# Patient Record
Sex: Male | Born: 2000 | Race: Black or African American | Hispanic: No | Marital: Single | Smoking: Never smoker
Health system: Southern US, Community
[De-identification: ages and names within clinical notes are randomized; demographics above are authoritative.]

## PROBLEM LIST (undated history)

## (undated) DIAGNOSIS — R51 Headache: Secondary | ICD-10-CM

## (undated) DIAGNOSIS — G8929 Other chronic pain: Secondary | ICD-10-CM

---
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
## 2020-01-24 ENCOUNTER — Other Ambulatory Visit: Payer: Self-pay

## 2020-01-24 ENCOUNTER — Emergency Department (HOSPITAL_COMMUNITY)
Admission: EM | Admit: 2020-01-24 | Discharge: 2020-01-24 | Disposition: A | Discharge status: Home / Self Care | Payer: BC Managed Care – PPO | Attending: Emergency Medicine | Admitting: Emergency Medicine

## 2020-01-24 ENCOUNTER — Encounter (HOSPITAL_COMMUNITY): Payer: Self-pay | Admitting: Emergency Medicine

## 2020-01-24 DIAGNOSIS — R197 Diarrhea, unspecified: Secondary | ICD-10-CM | POA: Insufficient documentation

## 2020-01-24 DIAGNOSIS — R1013 Epigastric pain: Secondary | ICD-10-CM | POA: Diagnosis not present

## 2020-01-24 DIAGNOSIS — R112 Nausea with vomiting, unspecified: Secondary | ICD-10-CM | POA: Insufficient documentation

## 2020-01-24 LAB — URINALYSIS, ROUTINE W REFLEX MICROSCOPIC
Bilirubin Urine: NEGATIVE
Glucose, UA: NEGATIVE mg/dL
Hgb urine dipstick: NEGATIVE
Ketones, ur: NEGATIVE mg/dL
Leukocytes,Ua: NEGATIVE
Nitrite: NEGATIVE
Protein, ur: NEGATIVE mg/dL
Specific Gravity, Urine: 1.033 — ABNORMAL HIGH (ref 1.005–1.030)
pH: 5 (ref 5.0–8.0)

## 2020-01-24 LAB — COMPREHENSIVE METABOLIC PANEL
ALT: 22 U/L (ref 0–44)
AST: 18 U/L (ref 15–41)
Albumin: 4.5 g/dL (ref 3.5–5.0)
Alkaline Phosphatase: 87 U/L (ref 38–126)
Anion gap: 13 (ref 5–15)
BUN: 18 mg/dL (ref 6–20)
CO2: 24 mmol/L (ref 22–32)
Calcium: 10.1 mg/dL (ref 8.9–10.3)
Chloride: 101 mmol/L (ref 98–111)
Creatinine, Ser: 1.18 mg/dL (ref 0.61–1.24)
GFR, Estimated: >60 mL/min (ref >60)
Glucose, Bld: 122 mg/dL — ABNORMAL HIGH (ref 70–99)
Potassium: 4.1 mmol/L (ref 3.5–5.1)
Sodium: 138 mmol/L (ref 135–145)
Total Bilirubin: 0.9 mg/dL (ref 0.3–1.2)
Total Protein: 7.9 g/dL (ref 6.5–8.1)

## 2020-01-24 LAB — CBC
HCT: 49.3 % (ref 39.0–52.0)
Hemoglobin: 17.4 g/dL — ABNORMAL HIGH (ref 13.0–17.0)
MCH: 31.8 pg (ref 26.0–34.0)
MCHC: 35.3 g/dL (ref 30.0–36.0)
MCV: 90 fL (ref 80.0–100.0)
Platelets: 315 10*3/uL (ref 150–400)
RBC: 5.48 MIL/uL (ref 4.22–5.81)
RDW: 11.8 % (ref 11.5–15.5)
WBC: 9.6 10*3/uL (ref 4.0–10.5)
nRBC: 0 % (ref 0.0–0.2)

## 2020-01-24 LAB — RAPID URINE DRUG SCREEN, HOSP PERFORMED
Amphetamines: NOT DETECTED
Barbiturates: NOT DETECTED
Benzodiazepines: NOT DETECTED
Cocaine: NOT DETECTED
Opiates: NOT DETECTED
Tetrahydrocannabinol: NOT DETECTED

## 2020-01-24 LAB — LIPASE, BLOOD: Lipase: 27 U/L (ref 11–51)

## 2020-01-24 MED ORDER — SUCRALFATE 1 G PO TABS: 1.0000 g | ORAL_TABLET | Freq: Three times a day (TID) | ORAL | 0 refills | Status: AC

## 2020-01-24 MED ORDER — FAMOTIDINE 20 MG PO TABS: 20.0000 mg | ORAL_TABLET | Freq: Two times a day (BID) | ORAL | 0 refills | Status: AC

## 2020-01-24 MED ORDER — PANTOPRAZOLE SODIUM 40 MG IV SOLR
40.0000 mg | Freq: Once | INTRAVENOUS | Status: AC
Start: 2020-01-24 — End: 2020-01-24
  Administered 2020-01-24: 40 mg via INTRAVENOUS
  Filled 2020-01-24: qty 40

## 2020-01-24 MED ORDER — MORPHINE SULFATE (PF) 4 MG/ML IV SOLN
4.0000 mg | Freq: Once | INTRAVENOUS | Status: AC
  Administered 2020-01-24: 4 mg via INTRAVENOUS
  Filled 2020-01-24: qty 1

## 2020-01-24 MED ORDER — LIDOCAINE VISCOUS HCL 2 % MT SOLN
15.0000 mL | Freq: Once | OROMUCOSAL | Status: AC
  Administered 2020-01-24: 15 mL via ORAL
  Filled 2020-01-24: qty 15

## 2020-01-24 MED ORDER — SODIUM CHLORIDE 0.9 % IV BOLUS
1000.0000 mL | Freq: Once | INTRAVENOUS | Status: AC
Start: 2020-01-24 — End: 2020-01-24
  Administered 2020-01-24: 1000 mL via INTRAVENOUS

## 2020-01-24 MED ORDER — ALUM & MAG HYDROXIDE-SIMETH 200-200-20 MG/5ML PO SUSP
30.0000 mL | Freq: Once | ORAL | Status: AC
  Administered 2020-01-24: 30 mL via ORAL
  Filled 2020-01-24: qty 30

## 2020-01-24 MED ORDER — ONDANSETRON HCL 4 MG/2ML IJ SOLN
4.0000 mg | Freq: Once | INTRAMUSCULAR | Status: AC
  Administered 2020-01-24: 4 mg via INTRAVENOUS
  Filled 2020-01-24: qty 2

## 2020-01-24 MED ORDER — ONDANSETRON 4 MG PO TBDP: 4.0000 mg | ORAL_TABLET | Freq: Three times a day (TID) | ORAL | 0 refills | Status: AC | PRN

## 2020-01-24 NOTE — Discharge Instructions (Addendum)
Take the medications as prescribed.   Carafate to coat your stomach before meals Zofran for nausea Pepcid an acid reducer.  Return for new or worsening symptoms

## 2020-01-24 NOTE — ED Notes (Signed)
Pt discharge instructions and prescriptions reviewed with the patient. The patient verbalized understanding of both. Pt discharged. 

## 2020-01-24 NOTE — ED Triage Notes (Signed)
Patient reports generalized abdominal pain with emesis and diarrhea onset yesterday.

## 2020-01-24 NOTE — ED Provider Notes (Signed)
[facility] EMERGENCY DEPARTMENT Provider Note   CSN: [id number] Arrival date & time: 01/24/20  0932    History Chief Complaint  Patient presents with  . Abdominal Pain    Evan Shepard is Shepard 19 y.o. male with past medical history significant for IBS,  Chronic daily headache who presents for evaluation of Abd pain. Pain began 12 hours PTA.  Pain located epigastric region.  Does not radiate.  He does not frequently use Tylenol, ibuprofen, EtOH.  No marijuana use.  Associated NBNB emesis as well as 4 episodes of diarrhea that melena or blood per rectum.  Has had similar symptoms in the past and diagnosed with PUD.  Previously by [facility].  Posterior EGD done however was never performed.  He has not followed up with GI as he states his symptoms had improved with PPI.  He is no longer taking this.  Did have Zaxby's yesterday prior to symptoms beginning. No known sick contacts. Denies fever, chills, CP, SOB, hematemesis, back pain, dysuria, weakness. Took 2 Tylenol last night for the pain without relief. Denies additional aggravating or alleviating factors.  History obtained from patient and past medical records. No interpretor was used.   HPI     Past Medical History:  Diagnosis Date  . Chronic headaches     There are no problems to display for this patient.   History reviewed. No pertinent surgical history.     No family history on file.  Social History   Tobacco Use  . Smoking status: Never Smoker  . Smokeless tobacco: Never Used  Vaping Use  . Vaping Use: Never used  Substance Use Topics  . Alcohol use: No  . Drug use: No    Home Medications Prior to Admission medications   Medication Sig Start Date End Date Taking? Authorizing Provider  Acetaminophen (TYLENOL) 325 MG CAPS Take 1 tablet by mouth daily as needed (pain).   Yes [provider]  famotidine (PEPCID) 20 MG tablet Take 1 tablet (20 mg total) by mouth 2 (two) times  daily. 01/24/20   Evan Donna A, PA-C  ondansetron (ZOFRAN ODT) 4 MG disintegrating tablet Take 1 tablet (4 mg total) by mouth every 8 (eight) hours as needed for nausea or vomiting. 01/24/20   [name], PA-C  ondansetron (ZOFRAN) 4 MG tablet Take 1 tablet (4 mg total) by mouth every 6 (six) hours. As needed for nausea or vomiting Patient not taking: Reported on 01/24/2020 07/27/14   [name], MD  sucralfate (CARAFATE) 1 g tablet Take 1 tablet (1 g total) by mouth 4 (four) times daily -  with meals and at bedtime for 7 days. 01/24/20 01/31/20  Field [name], PA-C    Allergies    Patient has no known allergies.  Review of Systems   Review of Systems  Constitutional: Negative.   HENT: Negative.   Respiratory: Negative.   Cardiovascular: Negative.   Gastrointestinal: Positive for abdominal pain, diarrhea, nausea and vomiting. Negative for abdominal distention, anal bleeding, blood in stool, constipation and rectal pain.  Genitourinary: Negative.   Musculoskeletal: Negative.   Skin: Negative.   Neurological: Negative.   All other systems reviewed and are negative.   Physical Exam Updated Vital Signs BP 114/61   Pulse 85   Temp 98.5 F (36.9 C) (Oral)   Resp 19   Ht 6\' 1"  (1.854 m)   Wt 85 kg   SpO2 100%   BMI 24.72 kg/m  Physical Exam Vitals and nursing note reviewed.  Constitutional:      General: He is not in acute distress.    Appearance: He is well-developed. He is not ill-appearing, toxic-appearing or diaphoretic.  HENT:     Head: Normocephalic and atraumatic.     Mouth/Throat:     Mouth: Mucous membranes are moist.  Eyes:     Pupils: Pupils are equal, round, and reactive to light.  Cardiovascular:     Rate and Rhythm: Normal rate and regular rhythm.     Heart sounds: Normal heart sounds.  Pulmonary:     Effort: Pulmonary effort is normal. No respiratory distress.     Breath sounds: Normal breath sounds.     Comments: Speaks in full  sentences without difficulty. Abdominal:     General: Bowel sounds are normal. There is no distension.     Palpations: Abdomen is soft.     Tenderness: There is abdominal tenderness in the epigastric area. There is no right CVA tenderness, left CVA tenderness, guarding or rebound. Negative signs include Murphy's sign and McBurney's sign.     Hernia: No hernia is present.     Comments: No overlying skin changes.  Musculoskeletal:        General: Normal range of motion.     Cervical back: Normal range of motion and neck supple.  Skin:    General: Skin is warm and dry.     Capillary Refill: Capillary refill takes less than 2 seconds.  Neurological:     General: No focal deficit present.     Mental Status: He is alert and oriented to person, place, and time.     ED Results / Procedures / Treatments   Labs (all labs ordered are listed, but only abnormal results are displayed) Labs Reviewed  COMPREHENSIVE METABOLIC PANEL - Abnormal; Notable for the following components:      Result Value   Glucose, Bld 122 (*)    All other components within normal limits  CBC - Abnormal; Notable for the following components:   Hemoglobin 17.4 (*)    All other components within normal limits  URINALYSIS, ROUTINE W REFLEX MICROSCOPIC - Abnormal; Notable for the following components:   Specific Gravity, Urine 1.033 (*)    All other components within normal limits  LIPASE, BLOOD  RAPID URINE DRUG SCREEN, HOSP PERFORMED    EKG None  Radiology No results found.  Procedures Procedures (including critical care time)  Medications Ordered in ED Medications  sodium chloride 0.9 % bolus 1,000 mL (1,000 mLs Intravenous New Bag/Given 01/24/20 0726)  ondansetron (ZOFRAN) injection 4 mg (4 mg Intravenous Given 01/24/20 0719)  alum & mag hydroxide-simeth (MAALOX/MYLANTA) 200-200-20 MG/5ML suspension 30 mL (30 mLs Oral Given 01/24/20 0726)    And  lidocaine (XYLOCAINE) 2 % viscous mouth solution 15 mL (15  mLs Oral Given 01/24/20 0726)  pantoprazole (PROTONIX) injection 40 mg (40 mg Intravenous Given 01/24/20 0726)  morphine 4 MG/ML injection 4 mg (4 mg Intravenous Given 01/24/20 1191)    ED Course  I have reviewed the triage vital signs and the nursing notes.  Pertinent labs & imaging results that were available during my care of the patient were reviewed by me and considered in my medical decision making (see chart for details).  Patient with epigastric pain. Afebrile, non septic non ill appearing. Began after eating Zaxby's. No hematemesis, does have history of PUD as well as IBS.  No chronic NSAID use, EtOH use.  Low suspicion  for perforated gastric ulcer, pancreatitis.  Mild tenderness epigastric region however negative Murphy sign low suspicion for cholecystitis, symptomatic cholelithiasis. Heart and lungs clear, no CP, SOB and without tachycardia, tachypnea, hypoxia, low suspicion for atypical cardiac, pulmonary etiology. No COVID exposures. Plan on labs, symptom managment and reassess.  Labs personally reviewed and interpreted:  CBC without leukocytosis, Hgb 17.4 suspect hemoconcentration  CMP with mild hyperglycemia at 122 however no additional electrolyte, renal or liver abnormalities Lipase 27 UA negative for infection, does show some dehydration UDS negative  0820: Patient reassessed. Pain improved. No emesis in ED. Suspect viral etiology, possible PUD given Hx of same. Will PO challenge. Repeat abd exam shows no tenderness, rebound or guarding. Non surgical abdomen.   0920: Patient reassessed.  No current pain.  Reevaluation benign abdominal exam.  He is tolerating p.o. intake without difficulty.  Patient is nontoxic, nonseptic appearing, in no apparent distress.  Patient's pain and other symptoms adequately managed in emergency department.  Fluid bolus given.  Labs, imaging and vitals reviewed.  Patient does not meet the SIRS or Sepsis criteria.  On repeat exam patient does not  have Shepard surgical abdomin and there are no peritoneal signs.  No indication of appendicitis, bowel obstruction, bowel perforation, cholecystitis, diverticulitis. Will dc home with PPI, Carafate and strict return precautions.  The patient has been appropriately medically screened and/or stabilized in the ED. I have low suspicion for any other emergent medical condition which would require further screening, evaluation or treatment in the ED or require inpatient management.  Patient is hemodynamically stable and in no acute distress.  Patient able to ambulate in department prior to ED.  Evaluation does not show acute pathology that would require ongoing or additional emergent interventions while in the emergency department or further inpatient treatment.  I have discussed the diagnosis with the patient and answered all questions.  Pain is been managed while in the emergency department and patient has no further complaints prior to discharge.  Patient is comfortable with plan discussed in room and is stable for discharge at this time.  I have discussed strict return precautions for returning to the emergency department.  Patient was encouraged to follow-up with PCP/specialist refer to at discharge.    MDM Rules/Calculators/Shepard&P                           Final Clinical Impression(s) / ED Diagnoses Final diagnoses:  Nausea vomiting and diarrhea  Epigastric pain    Rx / DC Orders ED Discharge Orders         Ordered    sucralfate (CARAFATE) 1 g tablet  3 times daily with meals & bedtime        01/24/20 0803    ondansetron (ZOFRAN ODT) 4 MG disintegrating tablet  Every 8 hours PRN        01/24/20 0803    famotidine (PEPCID) 20 MG tablet  2 times daily        01/24/20 0803           Rasheema Truluck A, PA-C 01/24/20 8563    Benjiman Core, MD 01/24/20 1606
